# Patient Record
Sex: Male | Born: 1970 | Race: White | Hispanic: No | Marital: Married | Smoking: Never smoker
Health system: Southern US, Community
[De-identification: ages and names within clinical notes are randomized; demographics above are authoritative.]

## PROBLEM LIST (undated history)

## (undated) DIAGNOSIS — T7840XA Allergy, unspecified, initial encounter: Secondary | ICD-10-CM

## (undated) DIAGNOSIS — Z789 Other specified health status: Secondary | ICD-10-CM

## (undated) DIAGNOSIS — G43909 Migraine, unspecified, not intractable, without status migrainosus: Secondary | ICD-10-CM

## (undated) HISTORY — DX: Allergy, unspecified, initial encounter: T78.40XA

## (undated) HISTORY — PX: WISDOM TOOTH EXTRACTION: SHX21

## (undated) HISTORY — PX: KNEE ARTHROSCOPY: SHX127

## (undated) HISTORY — DX: Migraine, unspecified, not intractable, without status migrainosus: G43.909

## (undated) HISTORY — DX: Other specified health status: Z78.9

## (undated) HISTORY — PX: TONSILLECTOMY AND ADENOIDECTOMY: SUR1326

## (undated) HISTORY — PX: SHOULDER ARTHROSCOPY: SHX128

## (undated) HISTORY — PX: GANGLION CYST EXCISION: SHX1691

---

## 2014-04-19 ENCOUNTER — Ambulatory Visit (HOSPITAL_BASED_OUTPATIENT_CLINIC_OR_DEPARTMENT_OTHER): Payer: BC Managed Care – PPO | Attending: Emergency Medicine | Admitting: Radiology

## 2014-04-19 VITALS — Ht 68.0 in | Wt 160.0 lb

## 2014-04-19 DIAGNOSIS — G4733 Obstructive sleep apnea (adult) (pediatric): Secondary | ICD-10-CM

## 2014-04-19 DIAGNOSIS — G4730 Sleep apnea, unspecified: Secondary | ICD-10-CM | POA: Diagnosis present

## 2014-04-19 DIAGNOSIS — G4710 Hypersomnia, unspecified: Secondary | ICD-10-CM | POA: Insufficient documentation

## 2014-04-22 DIAGNOSIS — G4733 Obstructive sleep apnea (adult) (pediatric): Secondary | ICD-10-CM

## 2014-04-22 NOTE — Sleep Study (Signed)
   NAME: Abdoul Encinas DATE OF BIRTH:  [birth year] MEDICAL RECORD NUMBER [id number]  LOCATION: [facility]  PHYSICIAN: YOUNG,CLINTON D  DATE OF STUDY: 04/19/2014  SLEEP STUDY TYPE: Nocturnal Polysomnogram               REFERRING PHYSICIAN: Harden Mo, MD  INDICATION FOR STUDY: hypersomnia with sleep apnea  EPWORTH SLEEPINESS SCORE:  2/24 HEIGHT: 5\' 8"  (172.7 cm)  WEIGHT: 160 lb (72.576 kg)    Body mass index is 24.33 kg/(m^2).  NECK SIZE: 14.5 in.  MEDICATIONS: charted for review  SLEEP ARCHITECTURE: total sleep time 322.5 minutes with sleep efficiency 87.3%. Stage I was 5.3%, stage II 59.8%, stage III 6.5%, REM 28.4% of total sleep time. Sleep latency 13.5 minutes, REM latency 87.0 minutes, awake after sleep onset 27.5 minutes, arousal index 15.3, bedtime medication: None  RESPIRATORY DATA: apnea hypopnea index breath since AHI) 1.7 per hour. 9 total events scored including one obstructive apnea and 8 hypopneas. Events were more common while supine. REM AHI 3.9 per hour. There were not enough events to permit application of split CPAP titration.  OXYGEN DATA: occasional mild snoring with oxygen desaturation to a nadir of 92% and mean saturation 96.5% on room air.  CARDIAC DATA: sinus rhythm with PACs  MOVEMENT/PARASOMNIA: a few incidental limb jerks were noted with little effect on sleep. No bathroom trips.  IMPRESSION/ RECOMMENDATION:   1) Within normal limits. Occasional respiratory event with sleep disturbance, AHI 1.7 per hour. The normal range for adults is an AHI from 0-5 events per hour). Occasional mild snoring with oxygen desaturation to a nadir of 92% and mean saturation 96.5% on room air.   Deneise Lever Diplomate, American Board of Sleep Medicine  ELECTRONICALLY SIGNED ON:  04/22/2014, 2:29 PM [place] PH: [phone]   FX: [phone] [place]

## 2014-06-28 ENCOUNTER — Other Ambulatory Visit (HOSPITAL_COMMUNITY): Payer: Self-pay | Admitting: Family Medicine

## 2014-06-28 DIAGNOSIS — R1013 Epigastric pain: Secondary | ICD-10-CM

## 2014-07-03 ENCOUNTER — Ambulatory Visit
Admission: RE | Admit: 2014-07-03 | Discharge: 2014-07-03 | Disposition: A | Discharge status: Home / Self Care | Payer: BLUE CROSS/BLUE SHIELD | Source: Ambulatory Visit | Attending: Family Medicine | Admitting: Family Medicine

## 2014-07-03 ENCOUNTER — Other Ambulatory Visit (HOSPITAL_COMMUNITY): Payer: Self-pay | Admitting: Family Medicine

## 2014-07-03 DIAGNOSIS — R1013 Epigastric pain: Secondary | ICD-10-CM

## 2015-11-12 ENCOUNTER — Encounter: Payer: Self-pay | Admitting: Gastroenterology

## 2015-11-12 ENCOUNTER — Telehealth: Payer: Self-pay | Admitting: Gastroenterology

## 2015-11-12 NOTE — Telephone Encounter (Signed)
Lipase was 60. Patient was offered an earlier appointment but declines. He does understand if his abdominal discomfort were to worsen, he should call back or go to the ER.

## 2015-12-12 ENCOUNTER — Encounter: Payer: Self-pay | Admitting: Gastroenterology

## 2015-12-12 ENCOUNTER — Ambulatory Visit (INDEPENDENT_AMBULATORY_CARE_PROVIDER_SITE_OTHER): Payer: BLUE CROSS/BLUE SHIELD | Admitting: Gastroenterology

## 2015-12-12 VITALS — BP 112/66 | HR 72 | Ht 68.0 in | Wt 156.0 lb

## 2015-12-12 DIAGNOSIS — R1013 Epigastric pain: Secondary | ICD-10-CM

## 2015-12-12 NOTE — Patient Instructions (Signed)
You have been scheduled for a CT scan of the abdomen and pelvis at [facility] ([address]---this is in the same building as Press [facility]).   You are scheduled on 12/20/2015 at 9:00am. You should arrive 15 minutes prior to your appointment time for registration. Please follow the written instructions below on the day of your exam:  WARNING: IF YOU ARE ALLERGIC TO IODINE/X-RAY DYE, PLEASE NOTIFY RADIOLOGY IMMEDIATELY AT [phone]! YOU WILL BE GIVEN A 13 HOUR PREMEDICATION PREP.  1) Do not eat or drink anything after 5:00am (4 hours prior to your test) 2) You have been given 2 bottles of oral contrast to drink. The solution may taste  better if refrigerated, but do NOT add ice or any other liquid to this solution. Shake well before drinking.    Drink 1 bottle of contrast @ 7:00am (2 hours prior to your exam)  Drink 1 bottle of contrast @ 8:00am (1 hour prior to your exam)  You may take any medications as prescribed with a small amount of water except for the following: Metformin, Glucophage, Glucovance, Avandamet, Riomet, Fortamet, Actoplus Met, Janumet, Glumetza or Metaglip. The above medications must be held the day of the exam AND 48 hours after the exam.  The purpose of you drinking the oral contrast is to aid in the visualization of your intestinal tract. The contrast solution may cause some diarrhea. Before your exam is started, you will be given a small amount of fluid to drink. Depending on your individual set of symptoms, you may also receive an intravenous injection of x-ray contrast/dye. Plan on being at [facility] for 30 minutes or long, depending on the type of exam you are having performed.  If you have any questions regarding your exam or if you need to reschedule, you may call the CT department at [phone] between the hours of 8:00 am and 5:00 pm, Monday-Friday.  ________________________________________________________________________

## 2015-12-12 NOTE — Progress Notes (Signed)
HPI :  45 y.o male seen in consultation for a new patient visit for abdominal pain. He is otherwise healthy with no significant PMH other than knee arthroscopy.   Patient reports ongoing symptoms for 10 years or so of intermittent abdominal pain. He reports multiple episodes over years of intermittent pain, associated with a variety of symptoms. Episodes are typically rare and occur once every few months, but this past year occuring more frequently. He reports episodes of pain in the epigastric area, which can be associated with general malaise and flu like symptoms. Pain is located superior to the umbilicus and mostly in the epigastric area. Pain is severe when he gets it, and radiates through to his mid back. Some nausea with it, but no vomit.  Pain is rated 9/10 when it occurs. Pain usually lasts for 20 to 30 minutes at that severe level, and then gradually oes away over a few days. He can have some sensitivity to touch for a few days afterwards. He denies any episodes preceeding by eating anything specific or alcohol. He denies heavy alcohol use. Sometimes beer can cause bloating but not pain. He thinks eating heavy or greasy foods can sometimes trigger his symptoms with eating these types of symptoms within a day or so of onet. Episodes not preceeded by exercise. He denies any bowel habit changes when this occurs. He denies any blood in the stools. No fevers, no nightsweats, no weightloss. He denies any reflux symptoms in generally. Eating well otherwise. No weight loss. No trauma to the area. He has had multiple episodes in 2017 thus far. Last episode was a few weeks ago.   He has been seen by GI physicians in [place] and at the [facility] in the past. He has had a prior upper endoscopy in the past for these symptoms, x 2, the last 7 years ago, told both normal. He has had a prior colonoscopy he thinks > 10 years ago. He has never had a prior CT or cross sectional imaging of his abdomen.  He  has had a prior US in January which was normal without gallstones.    He denies any FH of pancreatic cancer or other significant malignancy. No tobacco use. Rare alcohol use. He endorses supplement use as above, episodes starting prior to using this.   He takes multiple dietary supplements as outlined below for "general health", he has been taking these for 9 months.   U/S abdomen - 07/03/14 - normal  Labs 10/19/2015 TSH 2.59 CBC - WBC 4.7, Hgb 15.4, HCT 44.5, platelet 220 ESR 6 Amylase 43, Lipase 60 (ULN 59) H pylori IgG negative ALT 18, AST 20, T bil 1.0, AP 43, Alb 4.9 He reports he has had had prior upper and lower endoscopies in the past. He reports intermittent and variable symptoms. He has had 2 episodes of symptoms of feeling run down, nausea, and    History reviewed. No pertinent past medical history.   Past Surgical History  Procedure Laterality Date  . Knee arthroscopy     Family History  Problem Relation Age of Onset  . Diabetes Father   . Colon cancer Neg Hx    Social History  Substance Use Topics  . Smoking status: Never Smoker   . Smokeless tobacco: Never Used  . Alcohol Use: No   Current Outpatient Prescriptions  Medication Sig Dispense Refill  . Acetylcarnitine HCl (ACETYL L-CARNITINE) 500 MG CAPS Take 2 capsules by mouth daily.    Marland Kitchen  cetirizine (ZYRTEC) 10 MG tablet Take 10 mg by mouth as needed.     . Garcinia Cambogia-Chromium 500-200 MG-MCG TABS Take by mouth. 2,400 mg daily    . Green Coffee Bean 400 MG CAPS Take by mouth. 1, 600 mg daily    . Multiple Vitamins-Minerals (CENTRUM ADULTS PO) Take by mouth.     No current facility-administered medications for this visit.   No Known Allergies   Review of Systems: All systems reviewed and negative except where noted in HPI.     Physical Exam: BP 112/66 mmHg  Pulse 72  Ht 5' 8"  (1.727 m)  Wt 156 lb (70.761 kg)  BMI 23.73 kg/m2 Constitutional: Pleasant,well-developed, male in no acute  distress. HEENT: Normocephalic and atraumatic. Conjunctivae are normal. No scleral icterus. Neck supple.  Cardiovascular: Normal rate, regular rhythm.  Pulmonary/chest: Effort normal and breath sounds normal. No wheezing, rales or rhonchi. Abdominal: Soft, nondistended, nontender. Bowel sounds active throughout. There are no masses palpable. No hepatomegaly. Extremities: no edema Lymphadenopathy: No cervical adenopathy noted. Neurological: Alert and oriented to person place and time. Skin: Skin is warm and dry. No rashes noted. Psychiatric: Normal mood and affect. Behavior is normal.   ASSESSMENT AND PLAN: 45 y/o male with intermittent episodes of severe epigastric pain as outlined above, associated with general malaise that can last days. In between episodes he is asymptomatic. Occurs sporadically without clear triggers other than perhaps eating a heavy / greasy meal within 24 hours. Prior evaluation includes multiple endoscopies and lab work reportedly which have been normal. Unclear etiology. His symptoms are not typical for biliary colic given the time course of his symptoms. He has had endoscopy when in pain without evidence of PUD. H pylori testing negative.  It would be most useful to obtain labs and cross sectional imaging at the time of maximal pain but this would be quite difficult to coordinate. Given he has not had any cross sectional imaging to date, I am recommending a CT abdomen / pelvis at this time, we will ensure the pancreas is normal and evaluate the small bowel. I will contact him with the results. In the interim I asked him to stop his multiple dietary supplements to ensure this is not related in any way.  He agreed with the plan   [place] Cellar, MD [facility] Pager [phone]

## 2015-12-13 ENCOUNTER — Telehealth: Payer: Self-pay | Admitting: Gastroenterology

## 2015-12-13 ENCOUNTER — Other Ambulatory Visit: Payer: Self-pay | Admitting: *Deleted

## 2015-12-13 NOTE — Telephone Encounter (Signed)
Yes that's fine, thanks

## 2015-12-13 NOTE — Telephone Encounter (Signed)
Stacy from [facility] called and patient remember when he had a CT 20 years ago, he had 3 hives after the contrast. He will need to be premedicated with Prednisone 50 mg po 13 hours, 7 hours and 1 hour prior to study and Benadryl 50 mg po 1 hour prior to study. Please, advise if ok to order this.

## 2015-12-14 ENCOUNTER — Other Ambulatory Visit: Payer: Self-pay

## 2015-12-14 MED ORDER — DIPHENHYDRAMINE HCL 25 MG PO CAPS: ORAL_CAPSULE | ORAL | Status: AC

## 2015-12-14 MED ORDER — PREDNISONE 50 MG PO TABS: ORAL_TABLET | ORAL | Status: DC

## 2015-12-14 NOTE — Telephone Encounter (Signed)
Left message for pt to call back. Scripts sent to pharmacy.  Spoke with pt and he is aware.

## 2015-12-20 ENCOUNTER — Ambulatory Visit (INDEPENDENT_AMBULATORY_CARE_PROVIDER_SITE_OTHER)
Admission: RE | Admit: 2015-12-20 | Discharge: 2015-12-20 | Disposition: A | Discharge status: Home / Self Care | Payer: BLUE CROSS/BLUE SHIELD | Source: Ambulatory Visit | Attending: Gastroenterology | Admitting: Gastroenterology

## 2015-12-20 DIAGNOSIS — R1013 Epigastric pain: Secondary | ICD-10-CM

## 2015-12-20 MED ORDER — IOPAMIDOL (ISOVUE-300) INJECTION 61%
100.0000 mL | Freq: Once | INTRAVENOUS | Status: AC | PRN
  Administered 2015-12-20: 100 mL via INTRAVENOUS

## 2016-01-14 ENCOUNTER — Ambulatory Visit: Payer: BLUE CROSS/BLUE SHIELD | Admitting: Gastroenterology

## 2016-11-12 ENCOUNTER — Other Ambulatory Visit: Payer: Self-pay | Admitting: Family Medicine

## 2016-11-12 DIAGNOSIS — R1084 Generalized abdominal pain: Secondary | ICD-10-CM

## 2016-12-18 ENCOUNTER — Ambulatory Visit
Admission: RE | Admit: 2016-12-18 | Discharge: 2016-12-18 | Disposition: A | Discharge status: Home / Self Care | Payer: BLUE CROSS/BLUE SHIELD | Source: Ambulatory Visit | Attending: Family Medicine | Admitting: Family Medicine

## 2016-12-18 DIAGNOSIS — R1084 Generalized abdominal pain: Secondary | ICD-10-CM

## 2016-12-18 MED ORDER — IOPAMIDOL (ISOVUE-300) INJECTION 61%
100.0000 mL | Freq: Once | INTRAVENOUS | Status: AC | PRN
  Administered 2016-12-18: 100 mL via INTRAVENOUS

## 2017-09-14 ENCOUNTER — Other Ambulatory Visit: Payer: Self-pay | Admitting: Family Medicine

## 2017-09-14 DIAGNOSIS — M25512 Pain in left shoulder: Secondary | ICD-10-CM

## 2017-09-20 ENCOUNTER — Ambulatory Visit
Admission: RE | Admit: 2017-09-20 | Discharge: 2017-09-20 | Disposition: A | Discharge status: Home / Self Care | Payer: BLUE CROSS/BLUE SHIELD | Source: Ambulatory Visit | Attending: Family Medicine | Admitting: Family Medicine

## 2017-09-20 DIAGNOSIS — M25512 Pain in left shoulder: Secondary | ICD-10-CM

## 2018-05-30 IMAGING — MR MR SHOULDER*L* W/O CM
5 series · 40 of 40 positions shown · non-contrast
Comparison: None.

CLINICAL DATA: Left shoulder pain and limited range of motion for
the past 6-8 weeks. No known injury. No prior surgery.

EXAM:
MRI OF THE LEFT SHOULDER WITHOUT CONTRAST
TECHNIQUE: Multiplanar, multisequence MR imaging of the shoulder was performed.
No intravenous contrast was administered.

[Series 3: T2 fat-sat · axial · 4.0mm · 0.62mm/px · z∈[-50,+46]mm · 10 of 21 slices shown (1 of 3)]
[im 1/21]
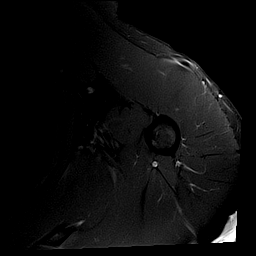
[im 3/21]
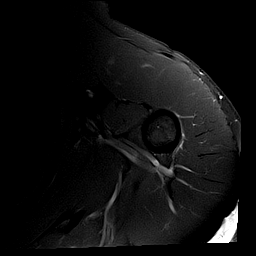
[im 5/21]
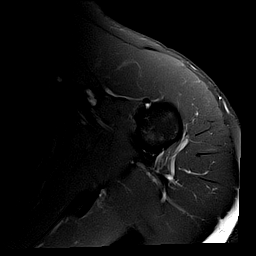
[im 7/21]
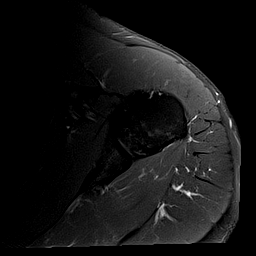
[im 9/21]
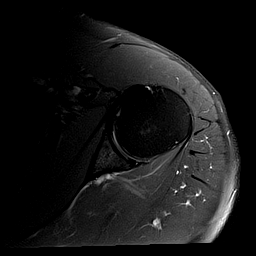
[im 12/21]
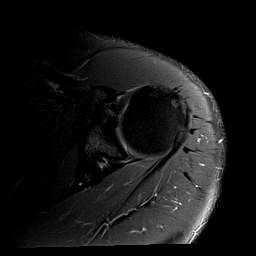
[im 14/21]
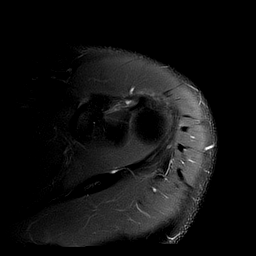
[im 16/21]
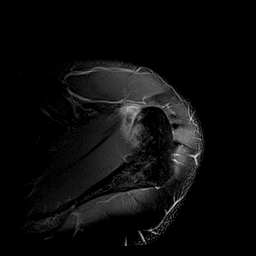
[im 18/21]
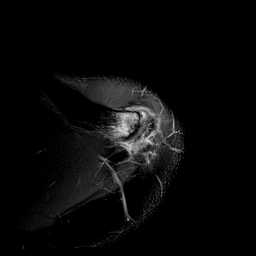
[im 21/21]
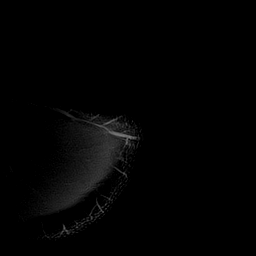

[Series 4: T2 fat-sat · oblique · 4.0mm · 0.55mm/px · 7 of 16 slices shown (2 of 3)]
[im 1/16]
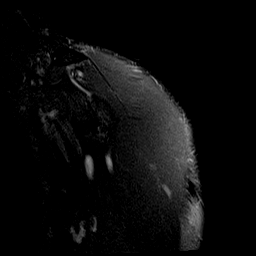
[im 3/16]
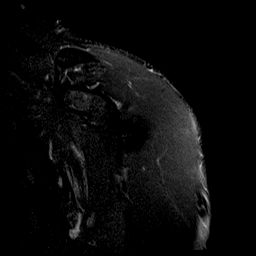
[im 6/16]
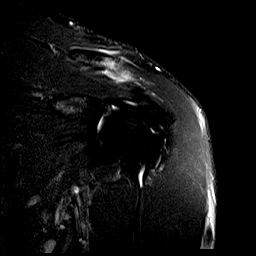
[im 8/16]
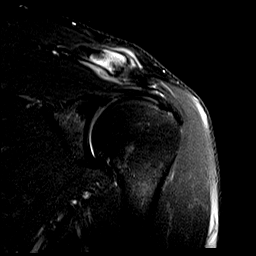
[im 11/16]
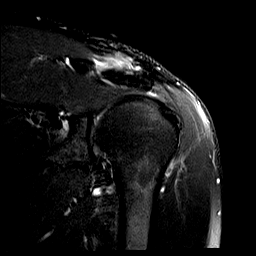
[im 13/16]
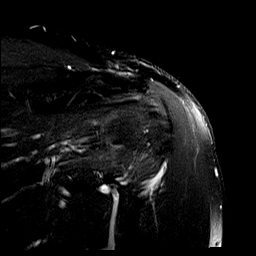
[im 16/16]
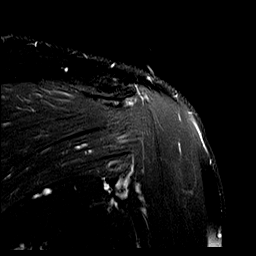

[Series 5: PD fat-sat · oblique · 4.0mm · 0.55mm/px · 7 of 16 slices shown]
[im 1/16]
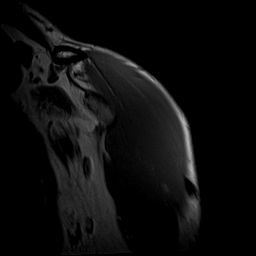
[im 3/16]
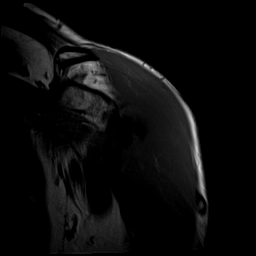
[im 6/16]
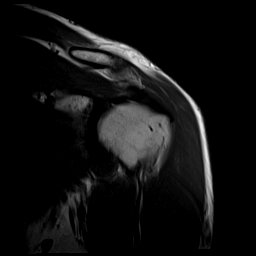
[im 8/16]
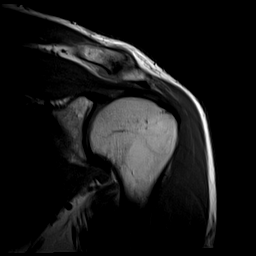
[im 11/16]
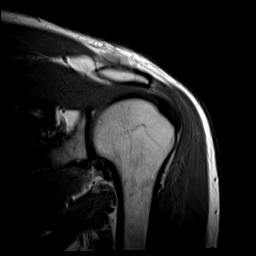
[im 13/16]
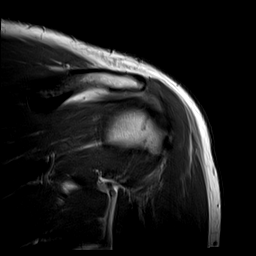
[im 16/16]
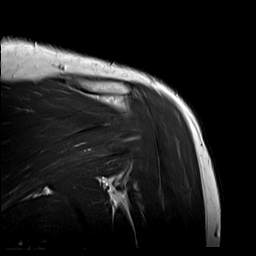

[Series 6: T2 fat-sat · oblique · 4.0mm · 0.55mm/px · 8 of 17 slices shown (3 of 3)]
[im 1/17]
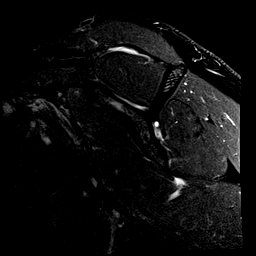
[im 3/17]
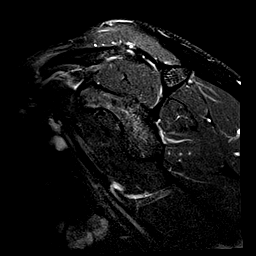
[im 5/17]
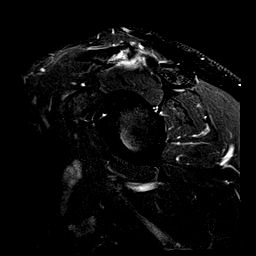
[im 7/17]
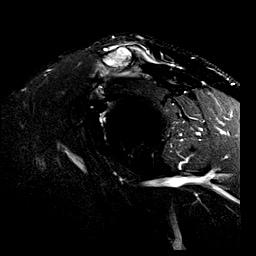
[im 10/17]
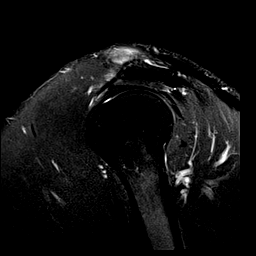
[im 12/17]
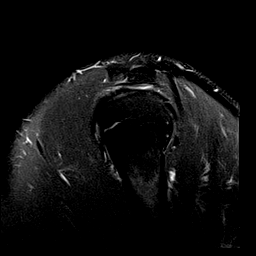
[im 14/17]
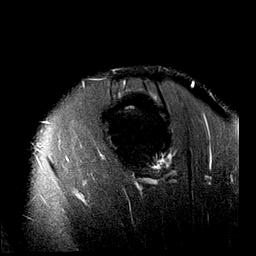
[im 17/17]
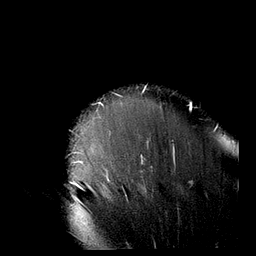

[Series 7: T1 · oblique · 4.0mm · 0.55mm/px · 8 of 17 slices shown]
[im 1/17]
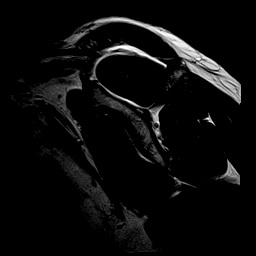
[im 3/17]
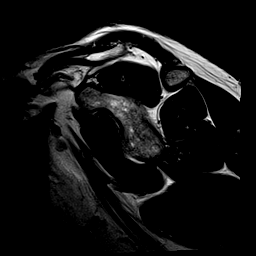
[im 5/17]
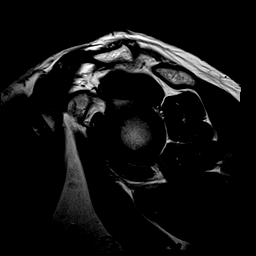
[im 7/17]
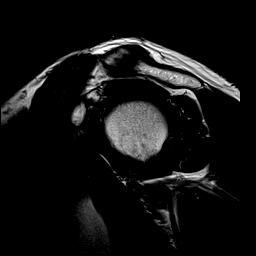
[im 10/17]
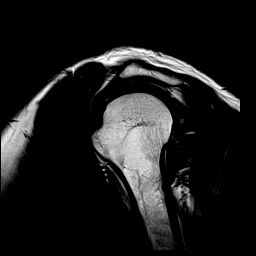
[im 12/17]
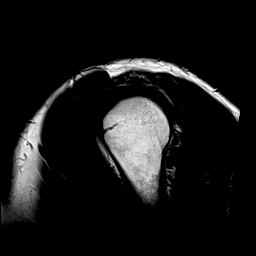
[im 14/17]
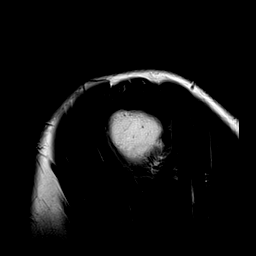
[im 17/17]
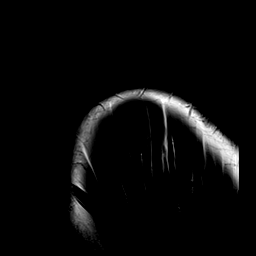

[40 of 40 positions shown; findings below may reference images not displayed]

FINDINGS: Rotator cuff: Intact supraspinatus, infraspinatus, teres minor and
subscapularis tendons. No significant tendinosis.

Muscles: No atrophy or abnormal signal of the muscles of the rotator
cuff.

Biceps long head:  Intact and normally positioned.

Acromioclavicular Joint: Prominent acromioclavicular periarticular
marrow edema and capsule thickening with pericapsular edema. Type II
acromion. No subacromial/subdeltoid bursal fluid.

Glenohumeral Joint: No joint effusion. No chondral defect.

Labrum: Grossly intact, but evaluation is limited by lack of
intraarticular fluid.

Bones:  No fracture or dislocation.  No focal bone lesion.

Other: None.
IMPRESSION: 1. Prominent acute inflammatory changes of the acromioclavicular
joint, likely degenerative.
2. No evidence of internal derangement.  Intact rotator cuff.

## 2020-02-16 ENCOUNTER — Encounter: Payer: Self-pay | Admitting: Gastroenterology

## 2020-04-06 ENCOUNTER — Ambulatory Visit (AMBULATORY_SURGERY_CENTER): Payer: Self-pay

## 2020-04-06 ENCOUNTER — Other Ambulatory Visit: Payer: Self-pay

## 2020-04-06 VITALS — Ht 68.0 in | Wt 160.0 lb

## 2020-04-06 DIAGNOSIS — Z1211 Encounter for screening for malignant neoplasm of colon: Secondary | ICD-10-CM

## 2020-04-06 MED ORDER — SUTAB 1479-225-188 MG PO TABS: 1.0000 | ORAL_TABLET | ORAL | 0 refills | Status: DC

## 2020-04-06 NOTE — Progress Notes (Signed)
No egg or soy allergy known to patient  No issues with past sedation with any surgeries or procedures No intubation problems in the past  No FH of Malignant Hyperthermia No diet pills per patient No home 02 use per patient  No blood thinners per patient  Pt denies issues with constipation  No A fib or A flutter  COVID 19 guidelines implemented in [facility] today with Pt and RN  Coupon given to pt in [facility] today , Code to Pharmacy  COVID vaccines completed on 09/2019 per pt;  Due to the COVID-19 pandemic we are asking patients to follow these guidelines. Please only bring one care partner. Please be aware that your care partner may wait in the car in the parking lot or if they feel like they will be too hot to wait in the car, they may wait in the lobby on the 4th floor. All care partners are required to wear a mask the entire time (we do not have any that we can provide them), they need to practice social distancing, and we will do a Covid check for all patient's and care partners when you arrive. Also we will check their temperature and your temperature. If the care partner waits in their car they need to stay in the parking lot the entire time and we will call them on their cell phone when the patient is ready for discharge so they can bring the car to the front of the building. Also all patient's will need to wear a mask into building.  

## 2020-04-10 ENCOUNTER — Encounter: Payer: Self-pay | Admitting: Gastroenterology

## 2020-04-20 ENCOUNTER — Other Ambulatory Visit: Payer: Self-pay

## 2020-04-20 ENCOUNTER — Encounter: Payer: Self-pay | Admitting: Gastroenterology

## 2020-04-20 ENCOUNTER — Ambulatory Visit (AMBULATORY_SURGERY_CENTER): Payer: BC Managed Care – PPO | Admitting: Gastroenterology

## 2020-04-20 VITALS — BP 108/69 | HR 61 | Temp 97.1°F | Resp 21 | Ht 68.0 in | Wt 160.0 lb

## 2020-04-20 DIAGNOSIS — Z1211 Encounter for screening for malignant neoplasm of colon: Secondary | ICD-10-CM

## 2020-04-20 DIAGNOSIS — D120 Benign neoplasm of cecum: Secondary | ICD-10-CM

## 2020-04-20 MED ORDER — SODIUM CHLORIDE 0.9 % IV SOLN: 500.0000 mL | Freq: Once | INTRAVENOUS | Status: DC

## 2020-04-20 NOTE — Progress Notes (Signed)
To PACU, VSS. Report to [email or web address] 

## 2020-04-20 NOTE — Patient Instructions (Signed)
Please read handouts provided. Continue present medications. Await pathology results.   YOU HAD AN ENDOSCOPIC PROCEDURE TODAY AT THE [facility]:   Refer to the procedure report that was given to you for any specific questions about what was found during the examination.  If the procedure report does not answer your questions, please call your gastroenterologist to clarify.  If you requested that your care partner not be given the details of your procedure findings, then the procedure report has been included in a sealed envelope for you to review at your convenience later.  YOU SHOULD EXPECT: Some feelings of bloating in the abdomen. Passage of more gas than usual.  Walking can help get rid of the air that was put into your GI tract during the procedure and reduce the bloating. If you had a lower endoscopy (such as a colonoscopy or flexible sigmoidoscopy) you may notice spotting of blood in your stool or on the toilet paper. If you underwent a bowel prep for your procedure, you may not have a normal bowel movement for a few days.  Please Note:  You might notice some irritation and congestion in your nose or some drainage.  This is from the oxygen used during your procedure.  There is no need for concern and it should clear up in a day or so.  SYMPTOMS TO REPORT IMMEDIATELY:  Following lower endoscopy (colonoscopy or flexible sigmoidoscopy):  Excessive amounts of blood in the stool  Significant tenderness or worsening of abdominal pains  Swelling of the abdomen that is new, acute  Fever of 100F or higher   For urgent or emergent issues, a gastroenterologist can be reached at any hour by calling [phone]. Do not use MyChart messaging for urgent concerns.    DIET:  We do recommend a small meal at first, but then you may proceed to your regular diet.  Drink plenty of fluids but you should avoid alcoholic beverages for 24 hours.  ACTIVITY:  You should plan to take it easy  for the rest of today and you should NOT DRIVE or use heavy machinery until tomorrow (because of the sedation medicines used during the test).    FOLLOW UP: Our staff will call the number listed on your records 48-72 hours following your procedure to check on you and address any questions or concerns that you may have regarding the information given to you following your procedure. If we do not reach you, we will leave a message.  We will attempt to reach you two times.  During this call, we will ask if you have developed any symptoms of COVID 19. If you develop any symptoms (ie: fever, flu-like symptoms, shortness of breath, cough etc.) before then, please call [phone].  If you test positive for Covid 19 in the 2 weeks post procedure, please call and report this information to us.    If any biopsies were taken you will be contacted by phone or by letter within the next 1-3 weeks.  Please call us at [phone] if you have not heard about the biopsies in 3 weeks.    SIGNATURES/CONFIDENTIALITY: You and/or your care partner have signed paperwork which will be entered into your electronic medical record.  These signatures attest to the fact that that the information above on your After Visit Summary has been reviewed and is understood.  Full responsibility of the confidentiality of this discharge information lies with you and/or your care-partner.  

## 2020-04-20 NOTE — Progress Notes (Signed)
Pt's states no medical or surgical changes since previsit or office visit. 

## 2020-04-20 NOTE — Progress Notes (Signed)
VS- Arman Bogus RN

## 2020-04-20 NOTE — Op Note (Signed)
[facility] Patient Name: Randy Koch Procedure Date: 04/20/2020 8:27 AM MRN: [id number] Endoscopist: Remo Lipps P. Havery Moros , MD Age: 49 Referring MD:  Date of Birth: December 25, 1970 Gender: Male Account #: [id number] Procedure:                Colonoscopy Indications:              Screening for colorectal malignant neoplasm Medicines:                Monitored Anesthesia Care Procedure:                Pre-Anesthesia Assessment:                           - Prior to the procedure, a History and Physical                            was performed, and patient medications and                            allergies were reviewed. The patient's tolerance of                            previous anesthesia was also reviewed. The risks                            and benefits of the procedure and the sedation                            options and risks were discussed with the patient.                            All questions were answered, and informed consent                            was obtained. Prior Anticoagulants: The patient has                            taken no previous anticoagulant or antiplatelet                            agents. ASA Grade Assessment: II - A patient with                            mild systemic disease. After reviewing the risks                            and benefits, the patient was deemed in                            satisfactory condition to undergo the procedure.                           After obtaining informed consent, the colonoscope  was passed under direct vision. Throughout the                            procedure, the patient's blood pressure, pulse, and                            oxygen saturations were monitored continuously. The                            Colonoscope was introduced through the anus and                            advanced to the the cecum, identified by                            appendiceal orifice and  ileocecal valve. The                            colonoscopy was performed without difficulty. The                            patient tolerated the procedure well. The quality                            of the bowel preparation was good. The ileocecal                            valve, appendiceal orifice, and rectum were                            photographed. Scope In: 8:33:29 AM Scope Out: 8:49:44 AM Scope Withdrawal Time: 0 hours 11 minutes 56 seconds  Total Procedure Duration: 0 hours 16 minutes 15 seconds  Findings:                 The perianal and digital rectal examinations were                            normal.                           A diminutive polyp was found in the cecum. The                            polyp was sessile. The polyp was removed with a                            cold snare. Resection and retrieval were complete.                           A few medium-mouthed diverticula were found in the                            sigmoid colon.  Internal hemorrhoids were found during                            retroflexion. The hemorrhoids were small.                           The exam was otherwise without abnormality. Complications:            No immediate complications. Estimated blood loss:                            Minimal. Estimated Blood Loss:     Estimated blood loss was minimal. Impression:               - One diminutive polyp in the cecum, removed with a                            cold snare. Resected and retrieved.                           - Diverticulosis in the sigmoid colon.                           - Internal hemorrhoids.                           - The examination was otherwise normal. Recommendation:           - Patient has a contact number available for                            emergencies. The signs and symptoms of potential                            delayed complications were discussed with the                             patient. Return to normal activities tomorrow.                            Written discharge instructions were provided to the                            patient.                           - Resume previous diet.                           - Continue present medications.                           - Await pathology results. Remo Lipps P. Jheri Mitter, MD 04/20/2020 8:52:35 AM This report has been signed electronically.

## 2020-04-24 ENCOUNTER — Telehealth: Payer: Self-pay

## 2020-04-24 ENCOUNTER — Telehealth: Payer: Self-pay | Admitting: *Deleted

## 2020-04-24 NOTE — Telephone Encounter (Signed)
2nd f/u call attempt.  LVM 

## 2020-04-24 NOTE — Telephone Encounter (Signed)
No answer, left message to call back later today, B.Keslie Gritz RN. 

## 2020-04-27 ENCOUNTER — Encounter: Payer: Self-pay | Admitting: Gastroenterology

## 2021-11-25 ENCOUNTER — Other Ambulatory Visit: Payer: Self-pay | Admitting: Family Medicine

## 2021-11-25 DIAGNOSIS — I714 Abdominal aortic aneurysm, without rupture, unspecified: Secondary | ICD-10-CM

## 2021-11-27 ENCOUNTER — Telehealth: Payer: Self-pay

## 2021-11-27 MED ORDER — DIPHENHYDRAMINE HCL 50 MG PO TABS: 50.0000 mg | ORAL_TABLET | Freq: Once | ORAL | 0 refills | Status: AC

## 2021-11-27 MED ORDER — PREDNISONE 50 MG PO TABS: ORAL_TABLET | ORAL | 0 refills | Status: AC

## 2021-11-27 NOTE — Telephone Encounter (Signed)
Phone call to patient to review instructions for 13 hr prep for CT w/ contrast on 12/06/21 at 11:40 AM. Prescription called into [facility]. Pt aware and verbalized understanding of instructions.  Prescription: Pt to take 50 mg of prednisone on 12/05/21 at 10:40 PM, 50 mg of prednisone on 12/06/21 at 4:40 AM, and 50 mg of prednisone on 12/06/21 at 10:40 AM. Pt is also to take 50 mg of benadryl on 12/06/21 at 10:40 AM. Please call [phone] with any questions.    Benadryl also sent in as a prescription, per the pts request. Pt advised not to drive the day of taking this medication as it may cause drowsiness. Pt verbalized understanding.

## 2021-12-06 ENCOUNTER — Ambulatory Visit
Admission: RE | Admit: 2021-12-06 | Discharge: 2021-12-06 | Disposition: A | Discharge status: Home / Self Care | Payer: 59 | Source: Ambulatory Visit | Attending: Family Medicine | Admitting: Family Medicine

## 2021-12-06 DIAGNOSIS — I714 Abdominal aortic aneurysm, without rupture, unspecified: Secondary | ICD-10-CM

## 2021-12-06 MED ORDER — IOPAMIDOL (ISOVUE-300) INJECTION 61%
100.0000 mL | Freq: Once | INTRAVENOUS | Status: AC | PRN
  Administered 2021-12-06: 100 mL via INTRAVENOUS

## 2021-12-23 ENCOUNTER — Inpatient Hospital Stay: Admission: RE | Admit: 2021-12-23 | Payer: BC Managed Care – PPO | Source: Ambulatory Visit

## 2022-01-02 ENCOUNTER — Encounter: Payer: Self-pay | Admitting: Vascular Surgery

## 2022-01-02 ENCOUNTER — Ambulatory Visit: Payer: 59 | Admitting: Vascular Surgery

## 2022-01-02 VITALS — BP 128/78 | HR 73 | Temp 98.4°F | Resp 20 | Ht 68.0 in | Wt 165.0 lb

## 2022-01-02 DIAGNOSIS — I723 Aneurysm of iliac artery: Secondary | ICD-10-CM | POA: Diagnosis not present

## 2022-01-02 NOTE — Progress Notes (Signed)
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  He does not have an abdominal aortic aneurysm.  I explained that her normal risk patient would consider elective repair at 3.5 cm.  Thus these aneurysms are small.  I have ordered a follow-up ultrasound in 1 year and I will see him back at that time.  Fortunately he is not a smoker.  In addition his blood pressure has been under good control.  REASON FOR CONSULT:    Bilateral common iliac artery aneurysms.  The consult is requested by Dr. Linna Darner.   HPI:   Randy Koch is a 51 y.o. male who is referred with bilateral common iliac artery aneurysms.  I have reviewed the records from the referring office.  The patient was seen on 11/25/2021 for a periodic exam.  He has been followed with hyperlipidemia, low testosterone, microcytic anemia, elevated liver enzymes, and also a known left common iliac artery aneurysm.  The aneurysm had enlarged slightly and is sent for vascular consultation.  He tells me that the aneurysm on the left was discovered back in 2018.  It had not changed much.  However on a more recent CT scan in June of this year it had increased slightly and there was some ectasia on the right side also.  For this reason the patient was sent for vascular consultation.  He denies any history of abdominal pain or back pain.  He has no family history of aneurysmal disease.  He is not a smoker.  His blood pressures been under good control.  Past Medical History:  Diagnosis Date   Allergy    seasonal allergies   Migraines    No pertinent past medical history     Family History  Problem Relation Age of Onset   Diabetes Father    Colon cancer Neg Hx    Colon polyps Neg Hx    Esophageal cancer Neg Hx    Rectal cancer Neg Hx    Stomach cancer Neg Hx     SOCIAL HISTORY: Social History   Tobacco Use   Smoking  status: Never   Smokeless tobacco: Never  Substance Use Topics   Alcohol use: No    Alcohol/week: 0.0 standard drinks of alcohol    Comment: special occassions    Allergies  Allergen Reactions   Contrast Media [Iodinated Contrast Media] Hives    Pt had a reaction of 3 hives after contrast approximately 20 years ago     Current Outpatient Medications  Medication Sig Dispense Refill   anastrozole (ARIMIDEX) 1 MG tablet Take by mouth once a week.     Bacillus Coagulans-Inulin (ALIGN PREBIOTIC-PROBIOTIC PO) Take by mouth daily.     cetirizine (ZYRTEC) 10 MG tablet Take 10 mg by mouth as needed.      clomiPHENE (CLOMID) 50 MG tablet Take 100 mg by mouth once a week.     fluticasone (FLONASE) 50 MCG/ACT nasal spray Place into both nostrils daily as needed.     Multiple Vitamins-Minerals (CENTRUM ADULTS PO) Take by mouth.     testosterone cypionate (DEPOTESTOSTERONE CYPIONATE) 200 MG/ML injection once a week.     diphenhydrAMINE (BENADRYL) 25 mg capsule Take 2 by mouth 1 hour before scan (Patient not taking: Reported on 01/02/2022) 30 capsule 0   diphenhydrAMINE (BENADRYL) 50 MG tablet Take 1 tablet (50 mg total) by mouth  once for 1 dose. Pt to take 50 mg of benadryl on 12/06/21 at 10:40 AM. Please call [phone] with any questions. 1 tablet 0   predniSONE (DELTASONE) 50 MG tablet Pt to take 50 mg of prednisone on 12/05/21 at 10:40 PM, 50 mg of prednisone on 12/06/21 at 4:40 AM, and 50 mg of prednisone on 12/06/21 at 10:40 AM. Pt is also to take 50 mg of benadryl on 12/06/21 at 10:40 AM. Please call [phone] with any questions. (Patient not taking: Reported on 01/02/2022) 3 tablet 0   No current facility-administered medications for this visit.    REVIEW OF SYSTEMS:  '[X]'$  denotes positive finding, '[ ]'$  denotes negative finding Cardiac  Comments:  Chest pain or chest pressure:    Shortness of breath upon exertion:    Short of breath when lying flat:    Irregular heart rhythm:         Vascular    Pain in calf, thigh, or hip brought on by ambulation:    Pain in feet at night that wakes you up from your sleep:     Blood clot in your veins:    Leg swelling:         Pulmonary    Oxygen at home:    Productive cough:     Wheezing:         Neurologic    Sudden weakness in arms or legs:     Sudden numbness in arms or legs:     Sudden onset of difficulty speaking or slurred speech:    Temporary loss of vision in one eye:     Problems with dizziness:         Gastrointestinal    Blood in stool:     Vomited blood:         Genitourinary    Burning when urinating:     Blood in urine:        Psychiatric    Major depression:         Hematologic    Bleeding problems:    Problems with blood clotting too easily:        Skin    Rashes or ulcers:        Constitutional    Fever or chills:    -  PHYSICAL EXAM:   Vitals:   01/02/22 1230  BP: 128/78  Pulse: 73  Resp: 20  Temp: 98.4 F (36.9 C)  SpO2: 96%  Weight: 165 lb (74.8 kg)  Height: '5\' 8"'$  (1.727 m)   Body mass index is 25.09 kg/m. GENERAL: The patient is a well-nourished male, in no acute distress. The vital signs are documented above. CARDIAC: There is a regular rate and rhythm.  VASCULAR: I do not detect carotid bruits. He has palpable femoral, popliteal, dorsalis pedis, posterior tibial pulses bilaterally. PULMONARY: There is good air exchange bilaterally without wheezing or rales. ABDOMEN: Soft and non-tender with normal pitched bowel sounds.  I do not palpate an abdominal aortic aneurysm. MUSCULOSKELETAL: There are no major deformities. NEUROLOGIC: No focal weakness or paresthesias are detected. SKIN: There are no ulcers or rashes noted. PSYCHIATRIC: The patient has a normal affect.  DATA:    CT ABDOMEN PELVIS: I have reviewed his CT abdomen and pelvis that was done in June of this year.  He does not have an abdominal aortic aneurysm.  He has a 1.8 cm right common iliac artery aneurysm.  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  Deitra Mayo [facility]

## 2022-12-18 ENCOUNTER — Other Ambulatory Visit: Payer: Self-pay | Admitting: *Deleted

## 2022-12-18 DIAGNOSIS — I723 Aneurysm of iliac artery: Secondary | ICD-10-CM

## 2023-01-08 ENCOUNTER — Other Ambulatory Visit: Payer: Self-pay

## 2023-01-08 ENCOUNTER — Ambulatory Visit (HOSPITAL_COMMUNITY)
Admission: RE | Admit: 2023-01-08 | Discharge: 2023-01-08 | Disposition: A | Discharge status: Home / Self Care | Payer: 59 | Source: Ambulatory Visit | Attending: Vascular Surgery | Admitting: Vascular Surgery

## 2023-01-08 ENCOUNTER — Ambulatory Visit: Payer: 59 | Admitting: Vascular Surgery

## 2023-01-08 ENCOUNTER — Encounter: Payer: Self-pay | Admitting: Vascular Surgery

## 2023-01-08 VITALS — BP 123/76 | HR 68 | Temp 98.5°F | Resp 20 | Ht 68.0 in | Wt 156.0 lb

## 2023-01-08 DIAGNOSIS — I723 Aneurysm of iliac artery: Secondary | ICD-10-CM

## 2023-01-08 DIAGNOSIS — Z8249 Family history of ischemic heart disease and other diseases of the circulatory system: Secondary | ICD-10-CM

## 2023-01-08 NOTE — Progress Notes (Signed)
REASON FOR VISIT:   Follow-up of bilateral common iliac artery aneurysms  MEDICAL ISSUES:   BILATERAL COMMON ILIAC ARTERY ANEURYSMS: When I saw the patient last he had a 1.8 cm right common iliac artery aneurysm.  He had a 2.3 cm left common iliac artery aneurysm.  He has no abdominal aortic aneurysm.  These aneurysms have increased slightly in size.  I explained that for a normal risk patient we would consider elective repair at 3.5 cm.  I have recommended a follow-up duplex scan in 1 year and he will be seen back on the PA schedule at that time.  I explained that I will be retiring.  Fortunately he is not a smoker.  His blood pressure is under good control.  Of note, Dr. Konrad Dolores did mention to the patient about taking Losartan in order to lower the risk of aneurysm expansion.  My impression is that the data is not especially strong currently however I think this would be perfectly reasonable to try a low-dose if Dr. Konrad Dolores feels that this might help.   HPI:   Randy Koch is a pleasant 52 y.o. male who I been following with bilateral common iliac artery aneurysms.  Since I saw him last, he denies any abdominal pain or back pain.  He is not a smoker.  His blood pressure is under excellent control.  He denies any chest pain or significant cardiac history.  However, his father just had a heart attack.  He would like to get a cardiac calcium CT scan.  There have been no significant changes to his medical history.  Past Medical History:  Diagnosis Date   Allergy    seasonal allergies   Migraines    No pertinent past medical history     Family History  Problem Relation Age of Onset   Diabetes Father    Colon cancer Neg Hx    Colon polyps Neg Hx    Esophageal cancer Neg Hx    Rectal cancer Neg Hx    Stomach cancer Neg Hx     SOCIAL HISTORY: Social History   Tobacco Use   Smoking status: Never   Smokeless tobacco: Never  Substance Use Topics   Alcohol use: No     Alcohol/week: 0.0 standard drinks of alcohol    Comment: special occassions    Allergies  Allergen Reactions   Contrast Media [Iodinated Contrast Media] Hives    Pt had a reaction of 3 hives after contrast approximately 20 years ago     Current Outpatient Medications  Medication Sig Dispense Refill   anastrozole (ARIMIDEX) 1 MG tablet Take by mouth once a week.     Bacillus Coagulans-Inulin (ALIGN PREBIOTIC-PROBIOTIC PO) Take by mouth daily.     cetirizine (ZYRTEC) 10 MG tablet Take 10 mg by mouth as needed.      clomiPHENE (CLOMID) 50 MG tablet Take 100 mg by mouth once a week.     fluticasone (FLONASE) 50 MCG/ACT nasal spray Place into both nostrils daily as needed.     Multiple Vitamins-Minerals (CENTRUM ADULTS PO) Take by mouth.     testosterone cypionate (DEPOTESTOSTERONE CYPIONATE) 200 MG/ML injection once a week.     diphenhydrAMINE (BENADRYL) 25 mg capsule Take 2 by mouth 1 hour before scan (Patient not taking: Reported on 01/02/2022) 30 capsule 0   diphenhydrAMINE (BENADRYL) 50 MG tablet Take 1 tablet (50 mg total) by mouth once for 1 dose. Pt to take 50 mg of benadryl on 12/06/21  at 10:40 AM. Please call [phone] with any questions. 1 tablet 0   predniSONE (DELTASONE) 50 MG tablet Pt to take 50 mg of prednisone on 12/05/21 at 10:40 PM, 50 mg of prednisone on 12/06/21 at 4:40 AM, and 50 mg of prednisone on 12/06/21 at 10:40 AM. Pt is also to take 50 mg of benadryl on 12/06/21 at 10:40 AM. Please call [phone] with any questions. (Patient not taking: Reported on 01/02/2022) 3 tablet 0   No current facility-administered medications for this visit.    REVIEW OF SYSTEMS:  [X]  denotes positive finding, [ ]  denotes negative finding Cardiac  Comments:  Chest pain or chest pressure:    Shortness of breath upon exertion:    Short of breath when lying flat:    Irregular heart rhythm:        Vascular    Pain in calf, thigh, or hip brought on by ambulation:    Pain in feet at night  that wakes you up from your sleep:     Blood clot in your veins:    Leg swelling:         Pulmonary    Oxygen at home:    Productive cough:     Wheezing:         Neurologic    Sudden weakness in arms or legs:     Sudden numbness in arms or legs:     Sudden onset of difficulty speaking or slurred speech:    Temporary loss of vision in one eye:     Problems with dizziness:         Gastrointestinal    Blood in stool:     Vomited blood:         Genitourinary    Burning when urinating:     Blood in urine:        Psychiatric    Major depression:         Hematologic    Bleeding problems:    Problems with blood clotting too easily:        Skin    Rashes or ulcers:        Constitutional    Fever or chills:     PHYSICAL EXAM:   Vitals:   01/08/23 0823  BP: 123/76  Pulse: 68  Resp: 20  Temp: 98.5 F (36.9 C)  SpO2: 97%  Weight: 156 lb (70.8 kg)  Height: 5\' 8"  (1.727 m)    GENERAL: The patient is a well-nourished male, in no acute distress. The vital signs are documented above. CARDIAC: There is a regular rate and rhythm.  VASCULAR: I do not detect carotid bruits. He has palpable femoral, popliteal, and pedal pulses. PULMONARY: There is good air exchange bilaterally without wheezing or rales. ABDOMEN: Soft and non-tender with normal pitched bowel sounds.  MUSCULOSKELETAL: There are no major deformities or cyanosis. NEUROLOGIC: No focal weakness or paresthesias are detected. SKIN: There are no ulcers or rashes noted. PSYCHIATRIC: The patient has a normal affect.  DATA:    DUPLEX ABDOMINAL AORTA: I have independently interpreted his duplex of the abdominal aorta.  He does not have an abdominal aortic aneurysm.  His right common iliac artery measures 2.1 cm in maximum diameter.  His left common iliac artery measures 2.7 cm in maximum diameter.  Thus these have increased in size slightly.  Waverly Ferrari [facility]  [phone]

## 2023-01-09 NOTE — Addendum Note (Signed)
Addended by: Leilani Able, Shinita Mac A on: 01/09/2023 09:37 AM   Modules accepted: Orders

## 2023-01-22 ENCOUNTER — Other Ambulatory Visit (HOSPITAL_COMMUNITY): Payer: 59

## 2023-01-26 ENCOUNTER — Ambulatory Visit (HOSPITAL_COMMUNITY)
Admission: RE | Admit: 2023-01-26 | Discharge: 2023-01-26 | Disposition: A | Discharge status: Home / Self Care | Payer: Self-pay | Source: Ambulatory Visit | Attending: Vascular Surgery | Admitting: Vascular Surgery

## 2023-01-26 DIAGNOSIS — Z8249 Family history of ischemic heart disease and other diseases of the circulatory system: Secondary | ICD-10-CM | POA: Insufficient documentation

## 2024-01-04 ENCOUNTER — Other Ambulatory Visit: Payer: Self-pay | Admitting: *Deleted

## 2024-01-04 DIAGNOSIS — I723 Aneurysm of iliac artery: Secondary | ICD-10-CM

## 2024-01-19 ENCOUNTER — Ambulatory Visit (HOSPITAL_COMMUNITY)
Admission: RE | Admit: 2024-01-19 | Discharge: 2024-01-19 | Disposition: A | Discharge status: Home / Self Care | Source: Ambulatory Visit | Attending: Vascular Surgery | Admitting: Vascular Surgery

## 2024-01-19 ENCOUNTER — Ambulatory Visit: Attending: Vascular Surgery | Admitting: Physician Assistant

## 2024-01-19 VITALS — BP 125/79 | Temp 98.3°F | Wt 158.0 lb

## 2024-01-19 DIAGNOSIS — I723 Aneurysm of iliac artery: Secondary | ICD-10-CM | POA: Diagnosis not present

## 2024-01-19 NOTE — Progress Notes (Signed)
 Office Note     CC:  follow up Requesting Provider:  Remonia Alm PARAS, MD  HPI: Randy Koch is a 53 y.o. ([birth year]) male who presents for surveillance of left common iliac artery aneurysm.  Previously this measured 2.7 cm in diameter.  He denies any new or changing abdominal or back pain.  He also denies any claudication or tissue changes of bilateral lower extremities.  He follows regularly with his PCP.  He denies tobacco use.   Past Medical History:  Diagnosis Date   Allergy    seasonal allergies   Migraines    No pertinent past medical history     Past Surgical History:  Procedure Laterality Date   GANGLION CYST EXCISION Right    hand   KNEE ARTHROSCOPY Right     x 2    SHOULDER ARTHROSCOPY Left    TONSILLECTOMY AND ADENOIDECTOMY     WISDOM TOOTH EXTRACTION      Social History   Socioeconomic History   Marital status: Married    Spouse name: Not on file   Number of children: Not on file   Years of education: Not on file   Highest education level: Not on file  Occupational History   Occupation: Engineer, agricultural  Tobacco Use   Smoking status: Never   Smokeless tobacco: Never  Vaping Use   Vaping status: Never Used  Substance and Sexual Activity   Alcohol use: No    Alcohol/week: 0.0 standard drinks of alcohol    Comment: special occassions   Drug use: No   Sexual activity: Not on file  Other Topics Concern   Not on file  Social History Narrative   Not on file   Social Drivers of Health   Financial Resource Strain: Not on file  Food Insecurity: Not on file  Transportation Needs: Not on file  Physical Activity: Not on file  Stress: Not on file  Social Connections: Not on file  Intimate Partner Violence: Not on file    Family History  Problem Relation Age of Onset   Diabetes Father    Colon cancer Neg Hx    Colon polyps Neg Hx    Esophageal cancer Neg Hx    Rectal cancer Neg Hx    Stomach cancer Neg Hx     Current Outpatient Medications   Medication Sig Dispense Refill   anastrozole (ARIMIDEX) 1 MG tablet Take by mouth once a week.     Bacillus Coagulans-Inulin (ALIGN PREBIOTIC-PROBIOTIC PO) Take by mouth daily.     cetirizine (ZYRTEC) 10 MG tablet Take 10 mg by mouth as needed.      clomiPHENE (CLOMID) 50 MG tablet Take 100 mg by mouth once a week.     diphenhydrAMINE  (BENADRYL ) 25 mg capsule Take 2 by mouth 1 hour before scan (Patient not taking: Reported on 01/02/2022) 30 capsule 0   diphenhydrAMINE  (BENADRYL ) 50 MG tablet Take 1 tablet (50 mg total) by mouth once for 1 dose. Pt to take 50 mg of benadryl  on 12/06/21 at 10:40 AM. Please call [phone] with any questions. 1 tablet 0   fluticasone (FLONASE) 50 MCG/ACT nasal spray Place into both nostrils daily as needed.     Multiple Vitamins-Minerals (CENTRUM ADULTS PO) Take by mouth.     predniSONE  (DELTASONE ) 50 MG tablet Pt to take 50 mg of prednisone  on 12/05/21 at 10:40 PM, 50 mg of prednisone  on 12/06/21 at 4:40 AM, and 50 mg of prednisone  on 12/06/21 at 10:40 AM. Pt is  also to take 50 mg of benadryl  on 12/06/21 at 10:40 AM. Please call [phone] with any questions. (Patient not taking: Reported on 01/02/2022) 3 tablet 0   testosterone cypionate (DEPOTESTOSTERONE CYPIONATE) 200 MG/ML injection once a week.     No current facility-administered medications for this visit.    Allergies  Allergen Reactions   Contrast Media [Iodinated Contrast Media] Hives    Pt had a reaction of 3 hives after contrast approximately 20 years ago      REVIEW OF SYSTEMS:  Negative unless noted in HPI [X]  denotes positive finding, [ ]  denotes negative finding Cardiac  Comments:  Chest pain or chest pressure:    Shortness of breath upon exertion:    Short of breath when lying flat:    Irregular heart rhythm:        Vascular    Pain in calf, thigh, or hip brought on by ambulation:    Pain in feet at night that wakes you up from your sleep:     Blood clot in your veins:    Leg  swelling:         Pulmonary    Oxygen at home:    Productive cough:     Wheezing:         Neurologic    Sudden weakness in arms or legs:     Sudden numbness in arms or legs:     Sudden onset of difficulty speaking or slurred speech:    Temporary loss of vision in one eye:     Problems with dizziness:         Gastrointestinal    Blood in stool:     Vomited blood:         Genitourinary    Burning when urinating:     Blood in urine:        Psychiatric    Major depression:         Hematologic    Bleeding problems:    Problems with blood clotting too easily:        Skin    Rashes or ulcers:        Constitutional    Fever or chills:      PHYSICAL EXAMINATION:  Vitals:   01/19/24 0911  BP: 125/79  Temp: 98.3 F (36.8 C)  TempSrc: Temporal  Weight: 158 lb (71.7 kg)    General:  WDWN in NAD; vital signs documented above Gait: Not observed HENT: WNL, normocephalic Pulmonary: normal non-labored breathing Cardiac: regular HR Abdomen: soft, NT, no masses Skin: without rashes Vascular Exam/Pulses: palpable DP pulses Extremities: without ischemic changes, without Gangrene , without cellulitis; without open wounds;  Musculoskeletal: no muscle wasting or atrophy  Neurologic: A&O X 3 Psychiatric:  The pt has Normal affect.   Non-Invasive Vascular Imaging:   2.6 cm left common iliac artery aneurysm 2.2 right common iliac artery aneurysm   ASSESSMENT/PLAN:: 53 y.o. male here for follow up for surveillance of iliac artery aneurysms  Left common iliac artery aneurysm is 2.6 cm today at largest diameter.  The right common iliac artery aneurysm measures 2.2 cm.  No indication for further imaging or repair at the current size.  We will repeat aortoiliac duplex in 1 year.  He was provided with CT and duplex imaging results over the past several years at the patient's request.  He will notify the office with any questions or concerns.   Donnice Sender, PA-C [facility] [phone]  Clinic MD:  Sheree on call
